# Patient Record
Sex: Female | Born: 1985 | Race: White | Hispanic: No | Marital: Married
Health system: Southern US, Community
[De-identification: ages and names within clinical notes are randomized; demographics above are authoritative.]

## PROBLEM LIST (undated history)

## (undated) ENCOUNTER — Telehealth

## (undated) ENCOUNTER — Encounter

---

## 2017-12-02 ENCOUNTER — Encounter: Attending: Family Medicine | Primary: Family Medicine

## 2017-12-05 NOTE — Progress Notes
Chart was opened prior to the patient check in - No Show. Please disregard.

## 2018-01-31 ENCOUNTER — Ambulatory Visit: Attending: Family Medicine | Primary: Family Medicine

## 2018-01-31 DIAGNOSIS — F411 Generalized anxiety disorder: Secondary | ICD-10-CM

## 2018-01-31 DIAGNOSIS — D72829 Elevated white blood cell count, unspecified: Principal | ICD-10-CM

## 2018-01-31 DIAGNOSIS — R5382 Chronic fatigue, unspecified: Secondary | ICD-10-CM

## 2018-01-31 DIAGNOSIS — F321 Major depressive disorder, single episode, moderate: Secondary | ICD-10-CM

## 2018-01-31 DIAGNOSIS — I781 Nevus, non-neoplastic: Secondary | ICD-10-CM

## 2018-01-31 DIAGNOSIS — L989 Disorder of the skin and subcutaneous tissue, unspecified: Secondary | ICD-10-CM

## 2018-01-31 DIAGNOSIS — Z6830 Body mass index (BMI) 30.0-30.9, adult: Secondary | ICD-10-CM

## 2018-01-31 MED ORDER — LEVONORGEST-ETH ESTRAD 91-DAY 0.15-0.03 &0.01 MG PO TABS
ORAL_TABLET | Freq: Every day | ORAL
Start: 2018-01-31 — End: ?

## 2018-01-31 MED ORDER — SERTRALINE HCL 50 MG PO TABS
5 refills | Status: CP
Start: 2018-01-31 — End: 2018-08-12

## 2018-01-31 MED ORDER — BUSPIRONE HCL 5 MG PO TABS
5 mg | Freq: Three times a day (TID) | ORAL | 1 refills | Status: CP | PRN
Start: 2018-01-31 — End: ?

## 2018-02-03 ENCOUNTER — Encounter: Attending: Family Medicine | Primary: Family Medicine

## 2018-03-03 ENCOUNTER — Ambulatory Visit: Attending: Family Medicine | Primary: Family Medicine

## 2018-03-03 DIAGNOSIS — D72829 Elevated white blood cell count, unspecified: Secondary | ICD-10-CM

## 2018-03-03 DIAGNOSIS — Z6830 Body mass index (BMI) 30.0-30.9, adult: Principal | ICD-10-CM

## 2018-03-03 DIAGNOSIS — F411 Generalized anxiety disorder: Secondary | ICD-10-CM

## 2018-03-03 DIAGNOSIS — F321 Major depressive disorder, single episode, moderate: Secondary | ICD-10-CM

## 2018-05-05 DIAGNOSIS — D72829 Elevated white blood cell count, unspecified: Principal | ICD-10-CM

## 2018-05-15 ENCOUNTER — Encounter: Attending: Family Medicine | Primary: Family Medicine

## 2018-08-11 DIAGNOSIS — F411 Generalized anxiety disorder: Principal | ICD-10-CM

## 2018-08-11 DIAGNOSIS — F321 Major depressive disorder, single episode, moderate: Secondary | ICD-10-CM

## 2018-08-12 MED ORDER — SERTRALINE HCL 50 MG PO TABS
5 refills | Status: CP
Start: 2018-08-12 — End: ?

## 2019-02-20 DIAGNOSIS — F411 Generalized anxiety disorder: Principal | ICD-10-CM

## 2019-02-22 DIAGNOSIS — F321 Major depressive disorder, single episode, moderate: Secondary | ICD-10-CM

## 2019-02-22 DIAGNOSIS — F411 Generalized anxiety disorder: Principal | ICD-10-CM

## 2019-02-23 MED ORDER — SERTRALINE HCL 50 MG PO TABS
5 refills
Start: 2019-02-23 — End: ?

## 2019-02-23 MED ORDER — BUSPIRONE HCL 5 MG PO TABS
1 refills
Start: 2019-02-23 — End: ?

## 2020-09-20 ENCOUNTER — Other Ambulatory Visit: Payer: Self-pay

## 2020-09-20 ENCOUNTER — Emergency Department (HOSPITAL_COMMUNITY): Payer: BC Managed Care – PPO

## 2020-09-20 ENCOUNTER — Encounter (HOSPITAL_COMMUNITY): Payer: Self-pay | Admitting: Emergency Medicine

## 2020-09-20 ENCOUNTER — Emergency Department (HOSPITAL_COMMUNITY)
Admission: EM | Admit: 2020-09-20 | Discharge: 2020-09-21 | Disposition: A | Discharge status: Home / Self Care | Payer: BC Managed Care – PPO | Attending: Emergency Medicine | Admitting: Emergency Medicine

## 2020-09-20 DIAGNOSIS — N200 Calculus of kidney: Secondary | ICD-10-CM

## 2020-09-20 DIAGNOSIS — R109 Unspecified abdominal pain: Secondary | ICD-10-CM

## 2020-09-20 DIAGNOSIS — R319 Hematuria, unspecified: Secondary | ICD-10-CM

## 2020-09-20 DIAGNOSIS — R1031 Right lower quadrant pain: Secondary | ICD-10-CM | POA: Diagnosis present

## 2020-09-20 LAB — COMPREHENSIVE METABOLIC PANEL
ALT: 13 U/L (ref 0–44)
AST: 16 U/L (ref 15–41)
Albumin: 4.1 g/dL (ref 3.5–5.0)
Alkaline Phosphatase: 84 U/L (ref 38–126)
Anion gap: 11 (ref 5–15)
BUN: 15 mg/dL (ref 6–20)
CO2: 27 mmol/L (ref 22–32)
Calcium: 9.9 mg/dL (ref 8.9–10.3)
Chloride: 98 mmol/L (ref 98–111)
Creatinine, Ser: 0.92 mg/dL (ref 0.44–1.00)
GFR, Estimated: >60 mL/min (ref >60)
Glucose, Bld: 130 mg/dL — ABNORMAL HIGH (ref 70–99)
Potassium: 3.4 mmol/L — ABNORMAL LOW (ref 3.5–5.1)
Sodium: 136 mmol/L (ref 135–145)
Total Bilirubin: 0.5 mg/dL (ref 0.3–1.2)
Total Protein: 7.4 g/dL (ref 6.5–8.1)

## 2020-09-20 LAB — I-STAT BETA HCG BLOOD, ED (MC, WL, AP ONLY): I-stat hCG, quantitative: <5 m[IU]/mL (ref <5)

## 2020-09-20 LAB — CBC
HCT: 44.2 % (ref 36.0–46.0)
Hemoglobin: 14.5 g/dL (ref 12.0–15.0)
MCH: 29.8 pg (ref 26.0–34.0)
MCHC: 32.8 g/dL (ref 30.0–36.0)
MCV: 90.9 fL (ref 80.0–100.0)
Platelets: 410 10*3/uL — ABNORMAL HIGH (ref 150–400)
RBC: 4.86 MIL/uL (ref 3.87–5.11)
RDW: 11.6 % (ref 11.5–15.5)
WBC: 16 10*3/uL — ABNORMAL HIGH (ref 4.0–10.5)
nRBC: 0 % (ref 0.0–0.2)

## 2020-09-20 LAB — URINALYSIS, ROUTINE W REFLEX MICROSCOPIC
Bilirubin Urine: NEGATIVE
Glucose, UA: 150 mg/dL — AB
Ketones, ur: NEGATIVE mg/dL
Leukocytes,Ua: NEGATIVE
Nitrite: NEGATIVE
Protein, ur: 100 mg/dL — AB
RBC / HPF: >50 RBC/hpf — ABNORMAL HIGH (ref 0–5)
Specific Gravity, Urine: 1.023 (ref 1.005–1.030)
WBC, UA: >50 WBC/hpf — ABNORMAL HIGH (ref 0–5)
pH: 5 (ref 5.0–8.0)

## 2020-09-20 LAB — LIPASE, BLOOD: Lipase: 32 U/L (ref 11–51)

## 2020-09-20 MED ORDER — CEPHALEXIN 500 MG PO CAPS: 500.0000 mg | ORAL_CAPSULE | Freq: Four times a day (QID) | ORAL | 0 refills | Status: DC

## 2020-09-20 MED ORDER — ONDANSETRON 4 MG PO TBDP: 4.0000 mg | ORAL_TABLET | Freq: Three times a day (TID) | ORAL | 0 refills | Status: DC | PRN

## 2020-09-20 NOTE — ED Provider Notes (Signed)
11:07 PM Assumed care from Dr. Myrtis Ser, please see their note for full history, physical and decision making until this point. In brief this is a 34 y.o. year old female who presented to the ED tonight with Abdominal Pain and Flank Pain     Suspected stone. Pending ct scan to eval for same. dispo likely home, meds depending on ct scan.   CT with stone in the distal ureter near the bladder.  6 mm.  Also with stones in bilateral pelvis these.  Suspect this is the cause of her pain however she is pre much pain control this time.  She does request that I give her nonnarcotic dose of pain medication.  Toradol ordered.  Will DC on meds.  She does breast-feed still however she states that she needs to wean anyway so she will work on that if she needs to take the narcotic pain medicines at home.  With questionable urine and a kidney stone we will go ahead and start antibiotics as well.  Urology follow-up as needed.  Discharge instructions, including strict return precautions for new or worsening symptoms, given. Patient and/or family verbalized understanding and agreement with the plan as described.   Labs, studies and imaging reviewed by myself and considered in medical decision making if ordered. Imaging interpreted by radiology.  Labs Reviewed  COMPREHENSIVE METABOLIC PANEL - Abnormal; Notable for the following components:      Result Value   Potassium 3.4 (*)    Glucose, Bld 130 (*)    All other components within normal limits  CBC - Abnormal; Notable for the following components:   WBC 16.0 (*)    Platelets 410 (*)    All other components within normal limits  URINALYSIS, ROUTINE W REFLEX MICROSCOPIC - Abnormal; Notable for the following components:   Color, Urine ORANGE (*)    APPearance TURBID (*)    Glucose, UA 150 (*)    Hgb urine dipstick LARGE (*)    Protein, ur 100 (*)    RBC / HPF >50 (*)    WBC, UA >50 (*)    Bacteria, UA RARE (*)    Crystals PRESENT (*)    All other components  within normal limits  URINE CULTURE  LIPASE, BLOOD  I-STAT BETA HCG BLOOD, ED ([facility], [facility], [facility] ONLY)    CT Renal Stone Study    (Results Pending)    No follow-ups on file.    Ivelise Castillo, Barbara Cower, MD 09/21/20 [phone]

## 2020-09-20 NOTE — ED Provider Notes (Addendum)
[facility] EMERGENCY DEPARTMENT Provider Note   CSN: [id number] Arrival date & time: 09/20/20  1729     History Chief Complaint  Patient presents with  . Abdominal Pain  . Flank Pain    Carla Henry is a 34 y.o. female.  Right flank pain rating to the right lower quadrant, symptoms of urinary frequency, no hematuria no fevers, chills but no diaphoresis.  History of renal stones.  Symptoms started earlier today were severe and are gradually improving.  She has no nausea vomiting.  No sick contacts no travel no trauma.        History reviewed. No pertinent past medical history.  There are no problems to display for this patient.      OB History   No obstetric history on file.     History reviewed. No pertinent family history.  Social History   Tobacco Use  . Smoking status: Not on file  Substance Use Topics  . Alcohol use: Not on file  . Drug use: Not on file    Home Medications Prior to Admission medications   Medication Sig Start Date End Date Taking? Authorizing Provider  cephALEXin (KEFLEX) 500 MG capsule Take 1 capsule (500 mg total) by mouth 4 (four) times daily for 5 days. 09/20/20 09/25/20  Sabino Donovan, MD  ondansetron (ZOFRAN ODT) 4 MG disintegrating tablet Take 1 tablet (4 mg total) by mouth every 8 (eight) hours as needed for up to 10 doses for nausea or vomiting. 09/20/20   Sabino Donovan, MD    Allergies    Penicillins  Review of Systems   Review of Systems  Constitutional: Positive for chills. Negative for fever.  HENT: Negative for congestion and rhinorrhea.   Respiratory: Negative for cough and shortness of breath.   Cardiovascular: Negative for chest pain and palpitations.  Gastrointestinal: Negative for diarrhea, nausea and vomiting.  Genitourinary: Positive for flank pain and frequency. Negative for difficulty urinating, dysuria and hematuria.  Musculoskeletal: Negative for arthralgias and back pain.  Skin:  Negative for rash and wound.  Neurological: Negative for light-headedness and headaches.    Physical Exam Updated Vital Signs BP 123/77 (BP Location: Left Arm)   Pulse (!) 105   Temp 97.9 F (36.6 C) (Oral)   Resp 16   Ht 5\' 6"  (1.676 m)   Wt 90.7 kg   SpO2 99%   BMI 32.28 kg/m   Physical Exam Vitals and nursing note reviewed. Exam conducted with a chaperone present.  Constitutional:      General: She is not in acute distress.    Appearance: Normal appearance.  HENT:     Head: Normocephalic and atraumatic.     Nose: No rhinorrhea.  Eyes:     General:        Right eye: No discharge.        Left eye: No discharge.     Conjunctiva/sclera: Conjunctivae normal.  Cardiovascular:     Rate and Rhythm: Normal rate and regular rhythm.  Pulmonary:     Effort: Pulmonary effort is normal. No respiratory distress.     Breath sounds: No stridor.  Abdominal:     General: Abdomen is flat. There is no distension.     Palpations: Abdomen is soft.     Tenderness: There is no right CVA tenderness, left CVA tenderness, guarding or rebound. Negative signs include Murphy's sign and McBurney's sign.  Musculoskeletal:        General: No tenderness or  signs of injury.  Skin:    General: Skin is warm and dry.  Neurological:     General: No focal deficit present.     Mental Status: She is alert. Mental status is at baseline.     Motor: No weakness.  Psychiatric:        Mood and Affect: Mood normal.        Behavior: Behavior normal.     ED Results / Procedures / Treatments   Labs (all labs ordered are listed, but only abnormal results are displayed) Labs Reviewed  COMPREHENSIVE METABOLIC PANEL - Abnormal; Notable for the following components:      Result Value   Potassium 3.4 (*)    Glucose, Bld 130 (*)    All other components within normal limits  CBC - Abnormal; Notable for the following components:   WBC 16.0 (*)    Platelets 410 (*)    All other components within normal limits    URINALYSIS, ROUTINE W REFLEX MICROSCOPIC - Abnormal; Notable for the following components:   Color, Urine ORANGE (*)    APPearance TURBID (*)    Glucose, UA 150 (*)    Hgb urine dipstick LARGE (*)    Protein, ur 100 (*)    RBC / HPF >50 (*)    WBC, UA >50 (*)    Bacteria, UA RARE (*)    Crystals PRESENT (*)    All other components within normal limits  URINE CULTURE  LIPASE, BLOOD  I-STAT BETA HCG BLOOD, ED ([facility], [facility], [facility] ONLY)    EKG None  Radiology No results found.  Procedures Procedures (including critical care time)  Medications Ordered in ED Medications - No data to display  ED Course  I have reviewed the triage vital signs and the nursing notes.  Pertinent labs & imaging results that were available during my care of the patient were reviewed by me and considered in my medical decision making (see chart for details).    MDM Rules/Calculators/A&P                          Well-appearing 34 year old female comes in with right flank pain, urinary studies show red blood cells white blood cells and rare bacteria but no leukocyte esterase no nitrates, less concerning for UTI, more concerning for renal stone.  Pain is much improved, she may have passed the stone of the stone may be in the bladder.  Kidney function reviewed on labs by myself shows no significant abnormalities.  Mild leukocytosis to 16.  Patient says she chronically has an elevated white blood cell count.  No previous labs or imaging to compare to if she is new to the area.  We'll get a CT stone study.  Pain control nausea control offered at this time and declined.  If there is no signs of stone or other pathology on CT scan we will treat for UTI with Keflex.  CT imaging pending at time of transfer of care. Pt care was handed off to on coming provider at 2330.  Complete history and physical and current plan have been communicated.  Please refer to their note for the remainder of ED care and ultimate  disposition.  Pt seen in conjunction with Dr. Clayborne Dana    Final Clinical Impression(s) / ED Diagnoses Final diagnoses:  Right flank pain  Hematuria, unspecified type    Rx / DC Orders ED Discharge Orders  Ordered    ondansetron (ZOFRAN ODT) 4 MG disintegrating tablet  Every 8 hours PRN        09/20/20 2305    cephALEXin (KEFLEX) 500 MG capsule  4 times daily        09/20/20 2305           Sabino Donovan, MD 09/20/20 2305    Sabino Donovan, MD 09/20/20 2313    Sabino Donovan, MD 09/20/20 2315

## 2020-09-20 NOTE — ED Triage Notes (Signed)
Patient arrives to ED with c/o RLQ and right flank pain starting early this morning. Pain started dull then progressed into a sharp pain. Pt states slight dysuria and took AZO today. Pt states chills but no fevers. No SOB, CP, vaginal bleeding, or abnormal discharge.

## 2020-09-20 NOTE — Discharge Instructions (Addendum)
You can take 600 mg of ibuprofen every 6 hours, you can take 1000 mg of Tylenol every 6 hours, you can alternate these every 3 or you can take them together.  

## 2020-09-21 MED ORDER — CEPHALEXIN 500 MG PO CAPS: 500.0000 mg | ORAL_CAPSULE | Freq: Four times a day (QID) | ORAL | 0 refills | Status: AC

## 2020-09-21 MED ORDER — TAMSULOSIN HCL 0.4 MG PO CAPS
0.4000 mg | ORAL_CAPSULE | Freq: Once | ORAL | Status: AC
  Administered 2020-09-21: 0.4 mg via ORAL
  Filled 2020-09-21: qty 1

## 2020-09-21 MED ORDER — CEPHALEXIN 250 MG PO CAPS
1000.0000 mg | ORAL_CAPSULE | Freq: Once | ORAL | Status: AC
  Administered 2020-09-21: 1000 mg via ORAL
  Filled 2020-09-21: qty 4

## 2020-09-21 MED ORDER — ONDANSETRON 4 MG PO TBDP: 4.0000 mg | ORAL_TABLET | Freq: Three times a day (TID) | ORAL | 0 refills | Status: AC | PRN

## 2020-09-21 MED ORDER — KETOROLAC TROMETHAMINE 60 MG/2ML IM SOLN
30.0000 mg | Freq: Once | INTRAMUSCULAR | Status: AC
  Administered 2020-09-21: 30 mg via INTRAMUSCULAR
  Filled 2020-09-21: qty 2

## 2020-09-21 MED ORDER — TAMSULOSIN HCL 0.4 MG PO CAPS: 0.4000 mg | ORAL_CAPSULE | Freq: Every day | ORAL | 0 refills | Status: AC

## 2020-09-21 MED ORDER — OXYCODONE-ACETAMINOPHEN 5-325 MG PO TABS
1.0000 | ORAL_TABLET | Freq: Four times a day (QID) | ORAL | 0 refills | Status: AC | PRN
Start: 2020-09-21 — End: ?

## 2020-09-22 LAB — URINE CULTURE: Culture: <10000 — AB

## 2021-07-01 IMAGING — CT CT RENAL STONE PROTOCOL
2 of 4 series · 17 of 46 positions shown, 19 images · non-contrast
Comparison: None.

CLINICAL DATA: Right flank pain

EXAM:
CT ABDOMEN AND PELVIS WITHOUT CONTRAST
TECHNIQUE: Multidetector CT imaging of the abdomen and pelvis was performed
following the standard protocol without IV contrast.

[Series 3: ap without · axial · non-contrast · 0.77mm/px · z∈[+845,+1215]mm · 14 of 84 slices shown, 16 images]
[im 5/84  soft-tissue]
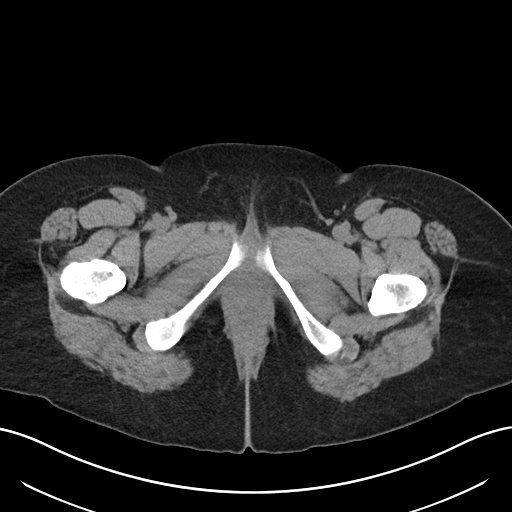
[im 5/84  bone]
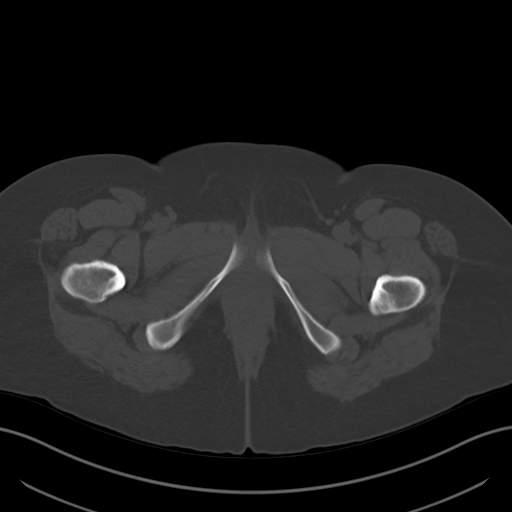
[im 13/84  soft-tissue]
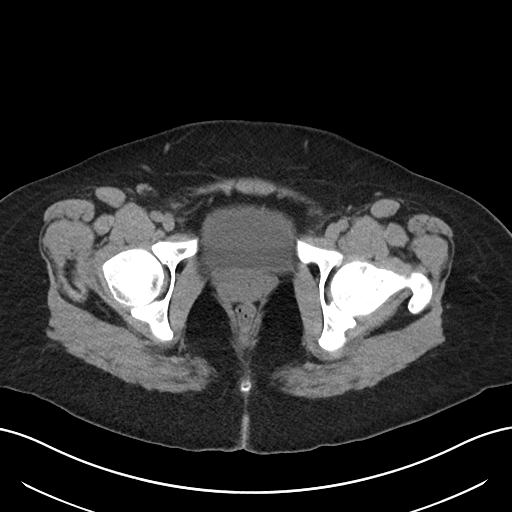
[im 17/84  soft-tissue]
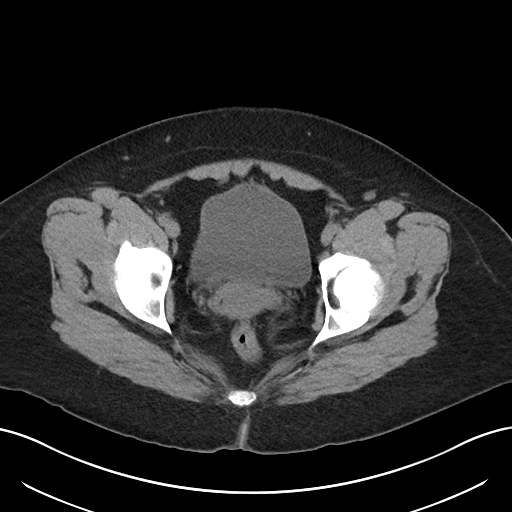
[im 21/84  soft-tissue]
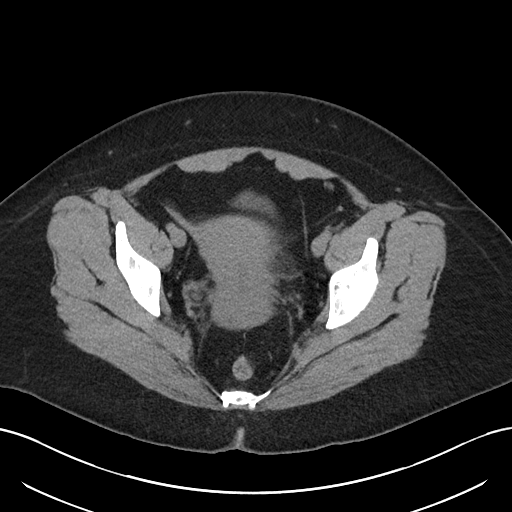
[im 30/84  soft-tissue]
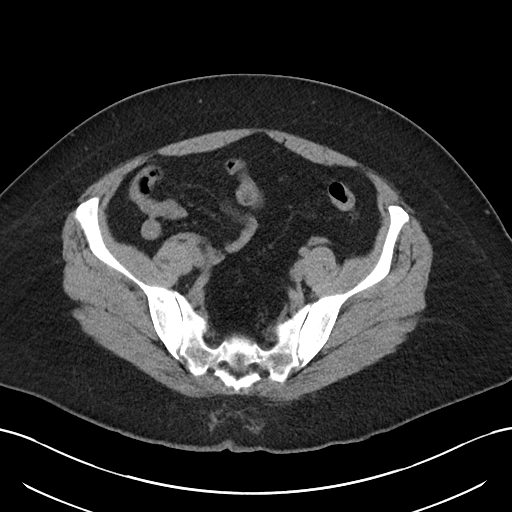
[im 34/84  soft-tissue]
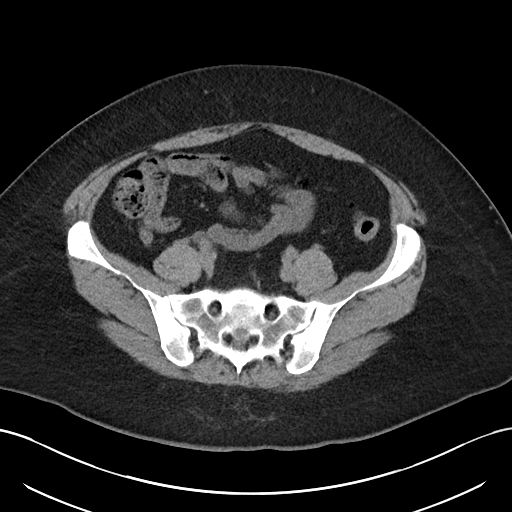
[im 38/84  soft-tissue]
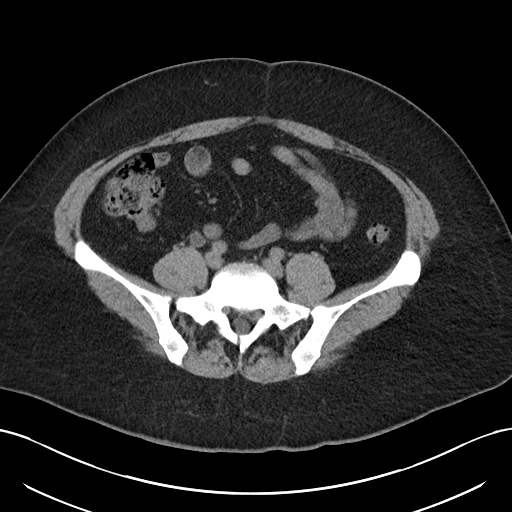
[im 46/84  soft-tissue]
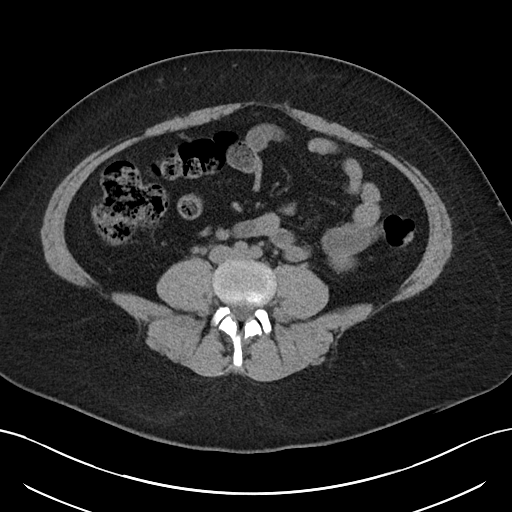
[im 50/84  soft-tissue]
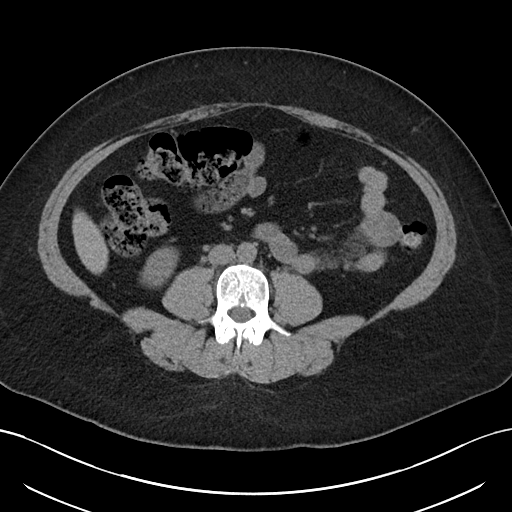
[im 50/84  bone]
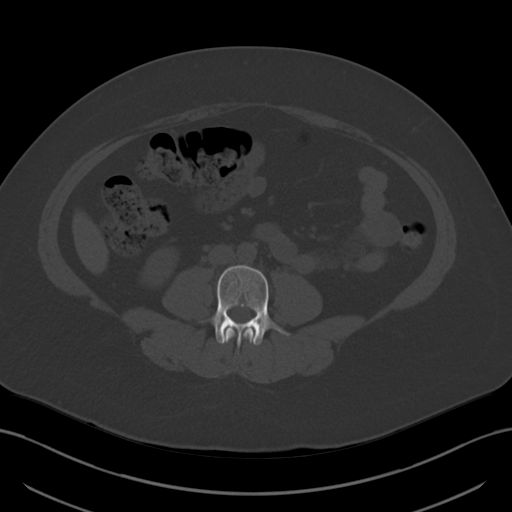
[im 54/84  soft-tissue]
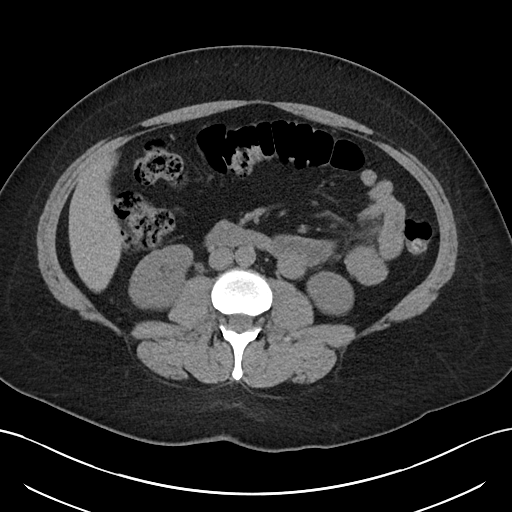
[im 63/84  soft-tissue]
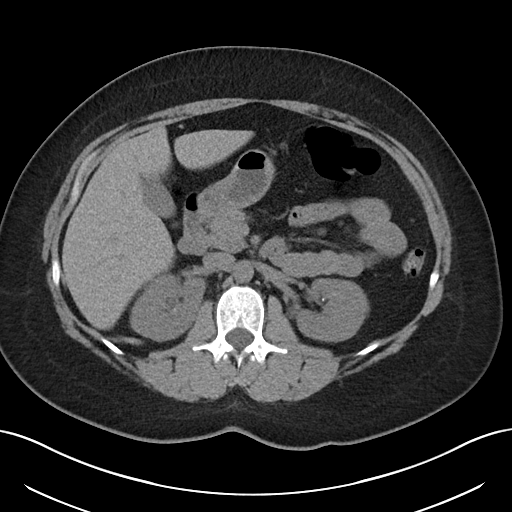
[im 67/84  soft-tissue]
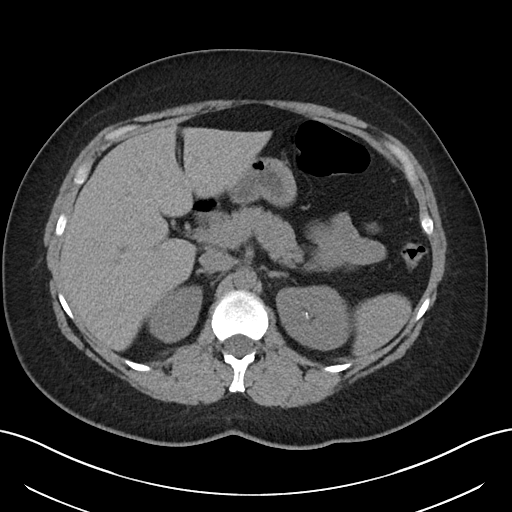
[im 71/84  soft-tissue]
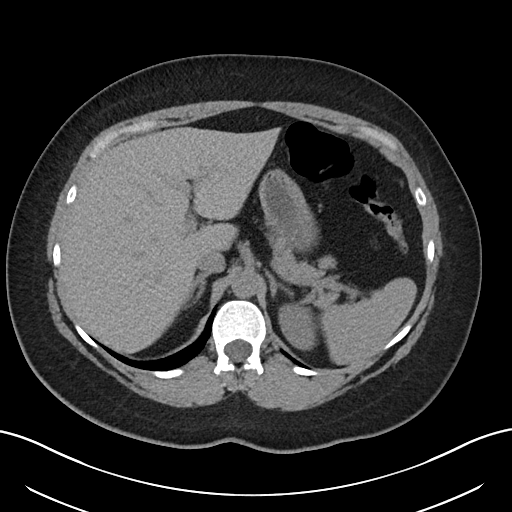
[im 79/84  soft-tissue]
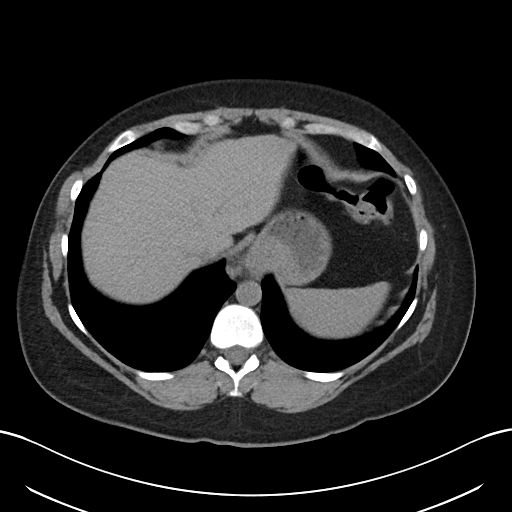

[Series 6: cor · coronal · 0.82mm/px · 3 of 107 slices shown]
[im 36/107  soft-tissue]
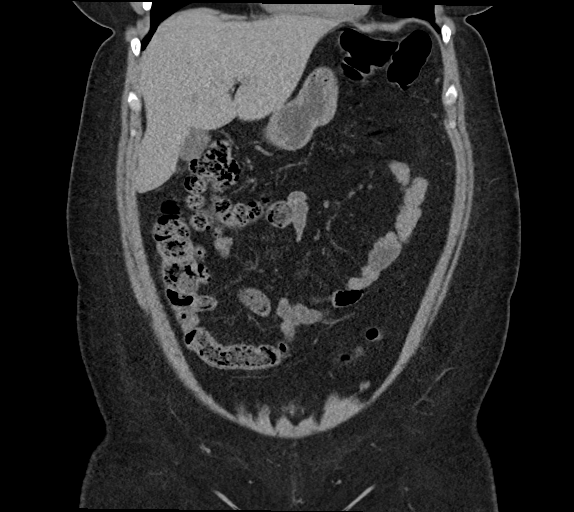
[im 48/107  soft-tissue]
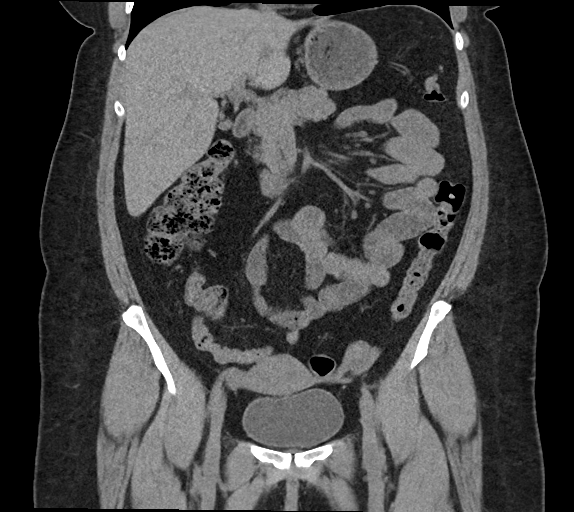
[im 59/107  soft-tissue]
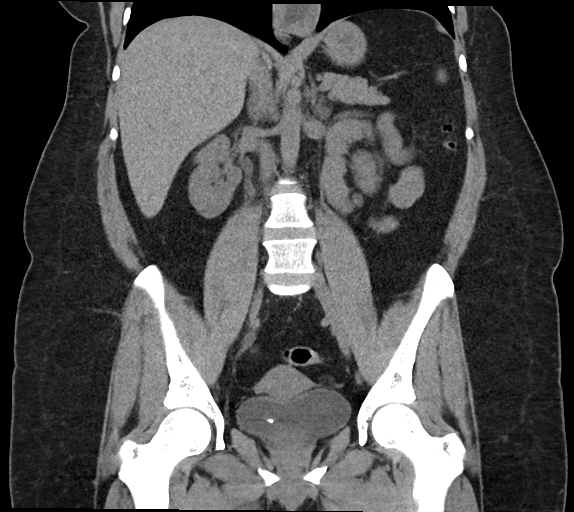

[17 of 46 positions shown; findings below may reference images not displayed]

FINDINGS: LOWER CHEST: Normal.

HEPATOBILIARY: Normal hepatic contours. No intra- or extrahepatic
biliary dilatation. Normal gallbladder.

PANCREAS: Normal pancreas. No ductal dilatation or peripancreatic
fluid collection.

SPLEEN: Normal.

ADRENALS/URINARY TRACT: The adrenal glands are normal. There is a 6
mm stone at the right ureterovesical junction causing moderate right
nephroureterolithiasis. There are multiple old nonobstructing stones
at both renal pelvises measuring up to 4 mm. The urinary bladder is
normal for degree of distention

STOMACH/BOWEL: Small sliding hiatal hernia. Normal duodenal course
and caliber. No small bowel dilatation or inflammation. No focal
colonic abnormality. Normal appendix.

VASCULAR/LYMPHATIC: Normal course and caliber of the major abdominal
vessels. No abdominal or pelvic lymphadenopathy.

REPRODUCTIVE: Normal uterus. No adnexal mass.

MUSCULOSKELETAL. No bony spinal canal stenosis or focal osseous
abnormality.

OTHER: None.
IMPRESSION: 1. Moderate right nephroureterolithiasis with 6 mm stone at the
right ureterovesical junction causing moderate right
nephroureterolithiasis.
2. Multiple old nonobstructing stones at both renal pelvises
measuring up to 4 mm.
